# Patient Record
Sex: Female | Born: 1965 | Race: Black or African American | Hispanic: No | Marital: Married | Smoking: Never smoker
Health system: Southern US, Community
[De-identification: ages and names within clinical notes are randomized; demographics above are authoritative.]

## PROBLEM LIST (undated history)

## (undated) DIAGNOSIS — I10 Essential (primary) hypertension: Secondary | ICD-10-CM

---

## 2001-12-28 ENCOUNTER — Emergency Department (HOSPITAL_COMMUNITY): Admission: EM | Admit: 2001-12-28 | Discharge: 2001-12-29 | Payer: Self-pay | Admitting: Emergency Medicine

## 2007-05-03 ENCOUNTER — Ambulatory Visit (HOSPITAL_COMMUNITY): Admission: RE | Admit: 2007-05-03 | Discharge: 2007-05-03 | Payer: Self-pay | Admitting: Internal Medicine

## 2007-05-03 IMAGING — CR DG CHEST 2V
2 series · 2 of 2 positions shown · non-contrast
Comparison: No prior studies are available for comparison purposes.

CLINICAL DATA: Cough.
 CHEST - 2 VIEW:

[view not recorded (1 of 2)]
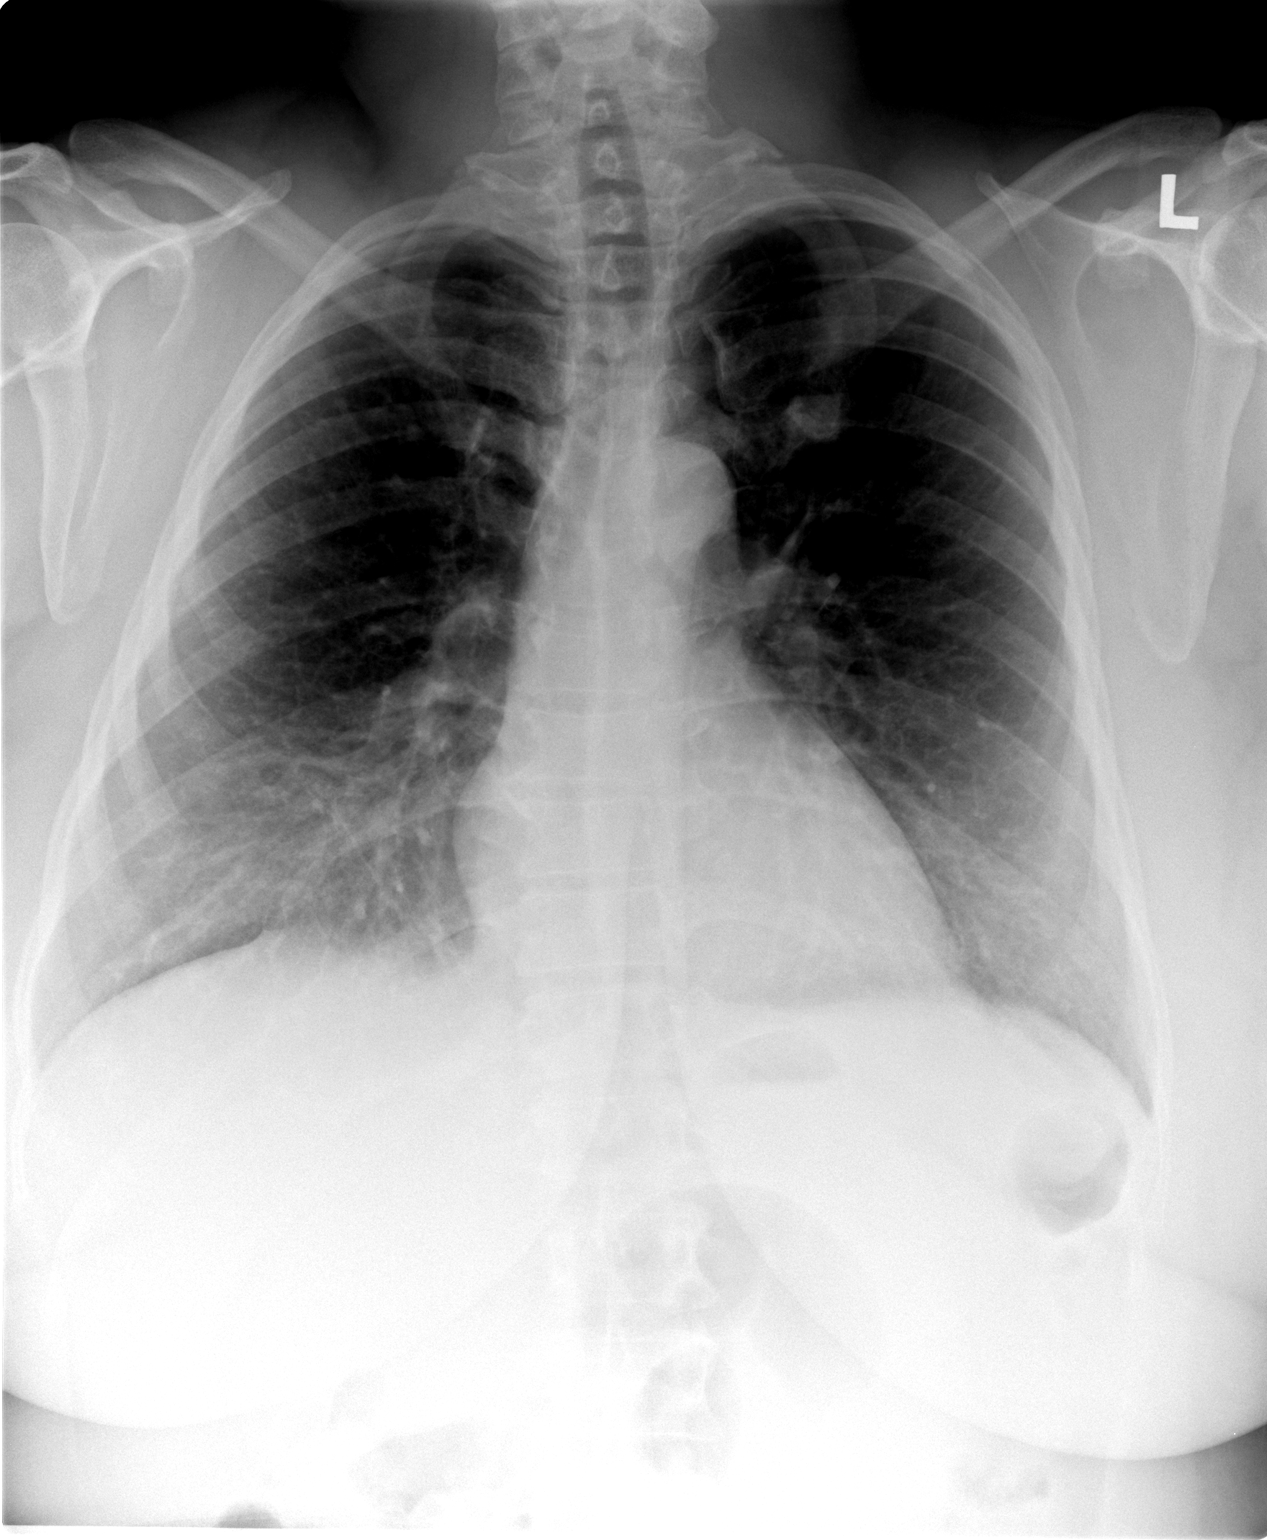

[view not recorded (2 of 2)]
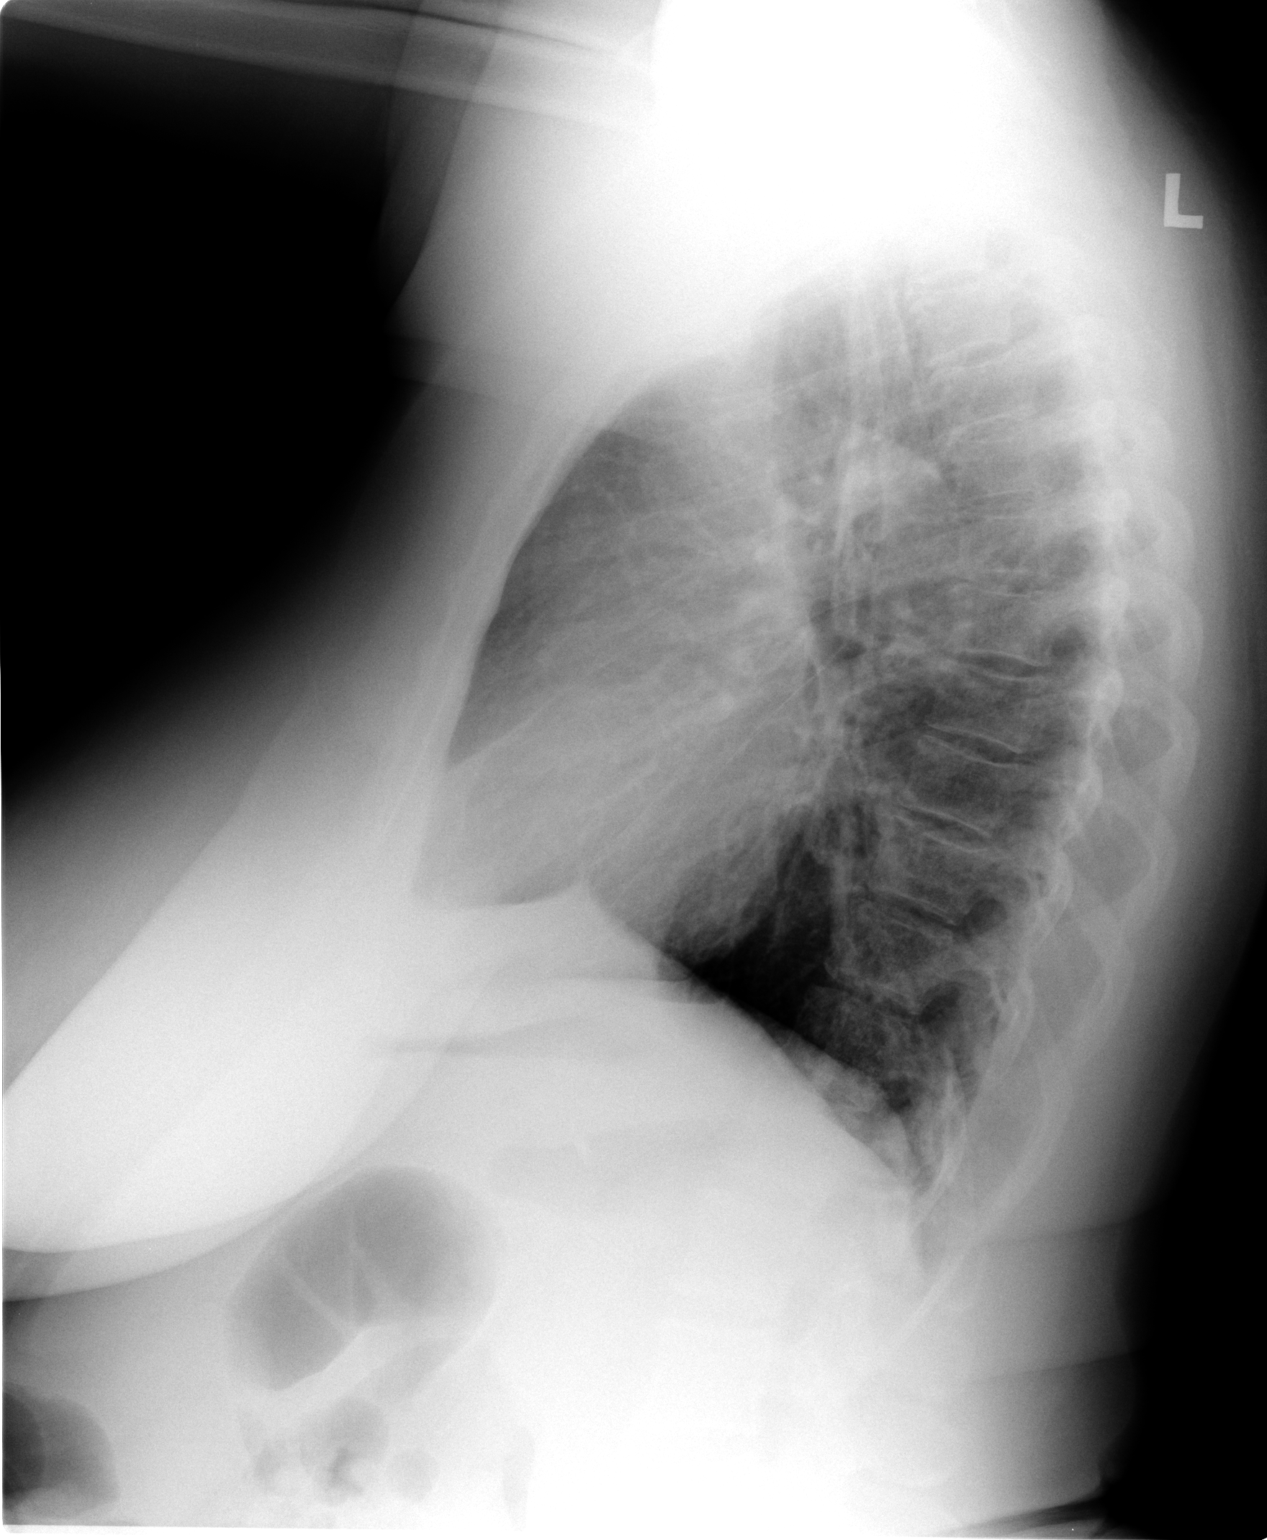

[2 of 2 positions shown; findings below may reference images not displayed]

FINDINGS: The cardiac silhouette, mediastinum, and pulmonary vasculature are within normal limits. Both lungs are clear.  The osseous structures are unremarkable.
IMPRESSION: Normal chest x-ray.

## 2007-05-09 ENCOUNTER — Ambulatory Visit: Payer: Self-pay | Admitting: Pulmonary Disease

## 2007-05-23 ENCOUNTER — Ambulatory Visit: Payer: Self-pay | Admitting: Pulmonary Disease

## 2008-03-14 ENCOUNTER — Encounter: Payer: Self-pay | Admitting: Pulmonary Disease

## 2008-03-14 DIAGNOSIS — R51 Headache: Secondary | ICD-10-CM | POA: Insufficient documentation

## 2008-03-14 DIAGNOSIS — R519 Headache, unspecified: Secondary | ICD-10-CM | POA: Insufficient documentation

## 2008-03-14 DIAGNOSIS — I10 Essential (primary) hypertension: Secondary | ICD-10-CM | POA: Insufficient documentation

## 2009-12-08 ENCOUNTER — Ambulatory Visit: Payer: Self-pay | Admitting: Family Medicine

## 2010-03-17 ENCOUNTER — Ambulatory Visit (HOSPITAL_COMMUNITY): Admission: RE | Admit: 2010-03-17 | Discharge: 2010-03-17 | Payer: Self-pay | Admitting: Internal Medicine

## 2010-04-05 ENCOUNTER — Encounter (HOSPITAL_COMMUNITY): Admission: RE | Admit: 2010-04-05 | Discharge: 2010-06-16 | Payer: Self-pay | Admitting: Internal Medicine

## 2010-10-18 NOTE — Letter (Signed)
Summary: Out of Work  [facility]  [address]   [place], [place] [address]   Phone: [phone]  Fax: [phone]    December 08, 2009   Employee:  Kylie Burton    To Whom It May Concern:   For Medical reasons, please excuse the above named employee from work for the following dates:  Start:   12/08/2009  Return :   12/10/2009  If you need additional information, please feel free to contact our office.         Sincerely,    Hassan Rowan MD

## 2010-10-18 NOTE — Assessment & Plan Note (Signed)
Summary: SORE THROAT/POSSIBLE STREP   Vital Signs:  Patient Profile:   45 Years Old Female CC:      Headache, sore throat, fever, achy,weak x 3 days Height:     63 inches Weight:      190 pounds O2 Sat:      99 % O2 treatment:    Room Air Temp:     100.2 degrees F oral Pulse rate:   90 / minute Pulse rhythm:   regular Resp:     16 per minute BP sitting:   148 / 94  (right arm) Cuff size:   large  Vitals Entered By: Avel Sensor, CMA                  Prior Medication List:  SINGULAIR 10 MG  TABS (MONTELUKAST SODIUM)    Current Allergies: No known allergies History of Present Illness Chief Complaint: Headache, sore throat, fever, achy,weak x 3 days History of Present Illness: Patient w/ sore throat for 2 days but progressively worse and now w/ achy neck and shoulder. Fever last night 100.   Current Problems: STREP THROAT (ICD-034.0) ACUTE NASOPHARYNGITIS (ICD-460) HEADACHE (ICD-784.0) HYPERTENSION (ICD-401.9)   Current Meds TOPROL XL 100 MG XR24H-TAB (METOPROLOL SUCCINATE)  HYDROCHLOROTHIAZIDE 50 MG TABS (HYDROCHLOROTHIAZIDE)   REVIEW OF SYSTEMS Constitutional Symptoms       Complains of fever and fatigue.     Denies chills, night sweats, weight loss, and weight gain.  Eyes       Denies change in vision, eye pain, eye discharge, glasses, contact lenses, and eye surgery. Ear/Nose/Throat/Mouth       Complains of sinus problems, sore throat, and hoarseness.      Denies hearing loss/aids, change in hearing, ear pain, ear discharge, dizziness, frequent runny nose, frequent nose bleeds, and tooth pain or bleeding.  Respiratory       Denies dry cough, productive cough, wheezing, shortness of breath, asthma, bronchitis, and emphysema/COPD.  Cardiovascular       Denies murmurs, chest pain, and tires easily with exhertion.    Gastrointestinal       Denies stomach pain, nausea/vomiting, diarrhea, constipation, blood in bowel movements, and indigestion. Genitourniary    Denies painful urination, kidney stones, and loss of urinary control. Neurological       Denies paralysis, seizures, and fainting/blackouts. Musculoskeletal       Complains of muscle pain and joint pain.      Denies joint stiffness, decreased range of motion, redness, swelling, muscle weakness, and gout.  Skin       Denies bruising, unusual mles/lumps or sores, and hair/skin or nail changes.  Psych       Denies mood changes, temper/anger issues, anxiety/stress, speech problems, depression, and sleep problems.  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 HEADACHE (ICD-784.0) HYPERTENSION (ICD-401.9)  Past Surgical History: Caesarean section  Family History: Reviewed history and no changes required. Mother, D, unk Father, D, HTN  Social History: Reviewed history and no changes required. 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  Plan New Orders: Bicillin CR 1.2 million units Injection [J0559] Admin of Therapeutic Inj  intramuscular or subcutaneous [96372] New Patient Level III [62376] Planning Comments:   as below  Follow Up: Follow up with Primary Physician Work/School Excuse: Return to work/school  tomorrow  The patient and/or caregiver has been counseled thoroughly with regard to medications prescribed including dosage, schedule, interactions, rationale for use, and possible side effects and they verbalize understanding.  Diagnoses and expected course of recovery discussed and will return if not improved as expected or if the condition worsens. Patient and/or caregiver verbalized understanding.   Patient Instructions: 1)  Recommend repeat strep test in 10 -14 days.  2)  Return to work on 12/10/2009.  3)  La bicillin given in office today should cover the infection.   Medication Administration  Injection # 1:    Medication: Bicillin CR 1.2 million units Injection    Diagnosis: STREP THROAT (ICD-034.0)    Route: IM    Site: L thigh    Exp Date: 05/19/2012    Lot #: [id number]    Mfr: Brooke Dare    Patient tolerated injection without complications    Given by: Emilio Math (December 08, 2009 12:10 PM)  Orders Added: 1)  Bicillin CR 1.2 million units Injection [J0559] 2)  Admin of Therapeutic Inj  intramuscular or subcutaneous [96372] 3)  New Patient Level III [99203]   Laboratory Results  Date/Time Received: December 08, 2009 12:11 PM  Date/Time Reported: December 08, 2009 12:12 PM   Other Tests  Rapid Strep: positive

## 2011-01-31 NOTE — Assessment & Plan Note (Signed)
[facility]                             PULMONARY OFFICE NOTE   [name], [name]                     MRN:          [id number]  DATE:05/09/2007                            DOB:          07/05/1966    HISTORY OF PRESENT ILLNESS:  The patient is a 45 year old female who I  have been asked to see for a chronic cough.  She states that the cough  started on April 12, 2007 where she thought she had a cold.  It  continued to be an issue, and she was given an inhaler of some type and  had no improvement.  She has been using Robitussin and Mucinex, and  recently started Singulair last week.  Since being on this medicine, she  is a tiny bit better.  She has had a chest x-ray that was totally  normal.  The patient describes her cough as a tickle in her throat and  has been doing a lot of throat clearing.  It is dry and hacky in nature,  but does not feel it is worse with conversation.  It is clearly worse  with laughing and taking a deep breath, and she does have ongoing  hoarseness.  The patient denies postnasal drip and also nasal  congestion.  She denies reflux symptoms.  She did not have asthma as a  child nor does she have symptoms of it at this particular time.  The  patient does state that at times she will get into cough paroxysms that  are quite debilitating for her.   PAST MEDICAL HISTORY:  1. Significant for hypertension.  The patient is on Toprol, but      currently is not taking it.  2. Chronic headaches.   CURRENT MEDICATIONS:  Singulair 10 mg one daily.   ALLERGIES:  The patient has no known drug allergies.   SOCIAL HISTORY:  She has never smoked.  She is married and has children.  She is a Microbiologist.  She lives with her husband and two children.   FAMILY HISTORY:  Remarkable for her father having heart disease;  otherwise, noncontributory.   REVIEW OF SYSTEMS:  As per History of Present Illness.  Also see patient  intake form  documented on the chart.   PHYSICAL EXAMINATION:  GENERAL:  She is an obese female in no acute  distress.  VITAL SIGNS:  Blood pressure 148/92, pulse 89, temperature 98.6, weight  206 pounds, 5 feet 3 inches tall, O2 saturation on room air 100%.  HEENT:  Pupils equal, round, reactive to light and accommodation.  Extraocular muscles are intact.  Nares are patent without discharge.  Oropharynx is clear.  NECK:  Supple without JVD or lymphadenopathy.  There is a question of  some mild thyromegaly.  CHEST:  Totally clear.  CARDIAC:  Reveals regular rate and rhythm.  ABDOMEN:  Soft, nontender with good bowel sounds.  GENITAL/RECTAL/BREASTS:  Were not done and not indicated.  EXTREMITIES:  Lower extremities are without edema.  Pulses are intact  distally.  NEUROLOGIC:  Alert and oriented with no obvious motor deficits.  IMPRESSION:  Probable cyclical cough.  I suspect this may have started  with an upper respiratory infection of some type, and now the cough is  continuing to be propagated by her throat clearing and by further  coughing.  There is really nothing to suggest postnasal drip or LPR, but  these will both have to be kept in mind if cough is not improved.  At  this point in time I think we need to work aggressively on cough  suppression and get her to quit clearing her throat, and let the  irritation posteriorly resolve.   PLAN:  1. Initiate cyclical cough protocol with Tussionex for cough      suppression as well as Tessalon Perles p.r.n.  I have gone over the      protocol with her, and have given her an instructional sheet to      include no throat clearing and complete voice rest for at least 2-3      days.  2. Will keep occult laryngopharyngeal reflux and postnasal drip in      mind.  3. The patient will follow up in two weeks or sooner if there are      problems.     Kylie Share, MD,FCCP  Electronically Signed    [facility]  DD: 05/14/2007  DT: 05/14/2007  Job  #: [id number]   cc:   Kylie Burton, D.O.

## 2012-01-26 ENCOUNTER — Encounter: Payer: Self-pay | Admitting: *Deleted

## 2012-01-26 ENCOUNTER — Emergency Department
Admission: EM | Admit: 2012-01-26 | Discharge: 2012-01-26 | Disposition: A | Discharge status: Home / Self Care | Payer: Federal, State, Local not specified - PPO | Source: Home / Self Care | Attending: Family Medicine | Admitting: Family Medicine

## 2012-01-26 DIAGNOSIS — IMO0001 Reserved for inherently not codable concepts without codable children: Secondary | ICD-10-CM

## 2012-01-26 DIAGNOSIS — L03019 Cellulitis of unspecified finger: Secondary | ICD-10-CM

## 2012-01-26 HISTORY — DX: Essential (primary) hypertension: I10

## 2012-01-26 MED ORDER — CEPHALEXIN 500 MG PO CAPS: 500.0000 mg | ORAL_CAPSULE | Freq: Three times a day (TID) | ORAL | Status: AC

## 2012-01-26 NOTE — ED Notes (Signed)
Pt c/o RT 4th finger infection x 1wk. Denies fever. She applied neosporin and witch hazel which seemed to make it worse.

## 2012-01-26 NOTE — Discharge Instructions (Signed)
Begin warm soaks several times daily.  Apply Bacitracin antibiotic and bandage until healed.  Paronychia Paronychia is an inflammatory reaction involving the folds of the skin surrounding the fingernail. This is commonly caused by an infection in the skin around a nail. The most common cause of paronychia is frequent wetting of the hands (as seen with bartenders, food servers, nurses or others who wet their hands). This makes the skin around the fingernail susceptible to infection by bacteria (germs) or fungus. Other predisposing factors are:  Aggressive manicuring.   Nail biting.   Thumb sucking.  The most common cause is a staphylococcal (a type of germ) infection, or a fungal (Candida) infection. When caused by a germ, it usually comes on suddenly with redness, swelling, pus and is often painful. It may get under the nail and form an abscess (collection of pus), or form an abscess around the nail. If the nail itself is infected with a fungus, the treatment is usually prolonged and may require oral medicine for up to one year. Your caregiver will determine the length of time treatment is required. The paronychia caused by bacteria (germs) may largely be avoided by not pulling on hangnails or picking at cuticles. When the infection occurs at the tips of the finger it is called felon. When the cause of paronychia is from the herpes simplex virus (HSV) it is called herpetic whitlow. TREATMENT  When an abscess is present treatment is often incision and drainage. This means that the abscess must be cut open so the pus can get out. When this is done, the following home care instructions should be followed. HOME CARE INSTRUCTIONS   It is important to keep the affected fingers very dry. Rubber or plastic gloves over cotton gloves should be used whenever the hand must be placed in water.   Keep wound clean, dry and dressed as suggested by your caregiver between warm soaks or warm compresses.   Soak in  warm water for fifteen to twenty minutes three to four times per day for bacterial infections. Fungal infections are very difficult to treat, so often require treatment for long periods of time.   For bacterial (germ) infections take antibiotics (medicine which kill germs) as directed and finish the prescription, even if the problem appears to be solved before the medicine is gone.   Only take over-the-counter or prescription medicines for pain, discomfort, or fever as directed by your caregiver.  SEEK IMMEDIATE MEDICAL CARE IF:  You have redness, swelling, or increasing pain in the wound.   You notice pus coming from the wound.   You have a fever.   You notice a bad smell coming from the wound or dressing.  Document Released: 02/28/2001 Document Revised: 08/24/2011 Document Reviewed: 10/30/2008 [facility] Patient Information 2012 [place], [place].

## 2012-01-26 NOTE — ED Provider Notes (Addendum)
History     CSN: [id number]  Arrival date & time 01/26/12  1328   First MD Initiated Contact with Patient 01/26/12 1349      Chief Complaint  Patient presents with  . Hand Pain    RT 4th digit      HPI Comments: Patient complains of pain and gradual increase in swelling at edge of her right 4th fingernail for one week.  No known trauma.  No drainage from the area.  Patient is a 46 y.o. female presenting with hand pain. The history is provided by the patient.  Hand Pain This is a new problem. Episode onset: 1 week ago. The problem occurs constantly. The problem has been gradually worsening. Associated symptoms comments: none. Exacerbated by: contact with right 4th fingertip. The symptoms are relieved by nothing. Treatments tried: applying witch hazel. The treatment provided no relief.    Past Medical History  Diagnosis Date  . Hypertension     Past Surgical History  Procedure Date  . Cesarean section     Family History  Problem Relation Age of Onset  . Seizures Mother   . Hypertension Father   . Heart attack Father     History  Substance Use Topics  . Smoking status: Never Smoker   . Smokeless tobacco: Not on file  . Alcohol Use: No    OB History    Grav Para Term Preterm Abortions TAB SAB Ect Mult Living                  Review of Systems  All other systems reviewed and are negative.    Allergies  Review of patient's allergies indicates no known allergies.  Home Medications   Current Outpatient Rx  Name Route Sig Dispense Refill  . HYDROCHLOROTHIAZIDE PO Oral Take by mouth.    . METOPROLOL SUCCINATE ER 100 MG PO TB24 Oral Take 100 mg by mouth daily. Take with or immediately following a meal.    . CEPHALEXIN 500 MG PO CAPS Oral Take 1 capsule (500 mg total) by mouth 3 (three) times daily. 21 capsule 0    BP 106/69  Pulse 61  Temp(Src) 98.6 F (37 C) (Oral)  Resp 16  Ht 5\' 3"  (1.6 m)  Wt 190 lb 12 oz (86.524 kg)  BMI 33.79 kg/m2  SpO2 100%   LMP 01/26/2012  Physical Exam  Nursing note and vitals reviewed. Constitutional: She is oriented to person, place, and time. She appears well-developed and well-nourished. No distress.  Musculoskeletal:       Hands:      At lateral edge of nail (as noted on diagram) there is swelling and fluctuance.  Distal Neurovascular function is intact.   Neurological: She is alert and oriented to person, place, and time.  Skin: Skin is warm and dry. No rash noted. There is erythema.    ED Course  Procedures  Incise and drain paronychia Risks and benefits of procedure explained to patient and verbal consent obtained.  Using sterile technique   cleansed affected area with alcohol pad. Identified the most fluctuant area of lesion at lateral edge of fingernail right 4th finger and gently created small incision with a #18 ga needle (no anesthesia necessary).  Expressed a small amount of pus.  Bacitracin and bandage applied.  Patient tolerated well.  Wound culture taken    Labs Reviewed  WOUND CULTURE pending      1. Paronychia of fourth finger, right  MDM   Wound culture pending.  Begin Keflex Begin warm soaks several times daily.  Apply Bacitracin antibiotic and bandage until healed. Return if not improving 3 to 4 days or if symptoms worsen.        Lattie Haw, MD 01/26/12 1419  Lattie Haw, MD 02/11/12 [phone]

## 2012-01-28 ENCOUNTER — Telehealth: Payer: Self-pay

## 2012-01-28 NOTE — ED Notes (Signed)
Left a message on voice mail asking how patient is feeling and advising to call back with any questions or concerns.  

## 2012-01-29 LAB — WOUND CULTURE
Gram Stain: NONE SEEN
Gram Stain: NONE SEEN

## 2014-07-01 ENCOUNTER — Encounter: Payer: Self-pay | Admitting: Internal Medicine

## 2016-09-24 ENCOUNTER — Telehealth: Payer: Self-pay | Admitting: Medical

## 2016-09-25 NOTE — Telephone Encounter (Signed)
Opened to review
